# Patient Record
Sex: Male | Born: 1980 | Race: White | Hispanic: No | Marital: Married | Smoking: Current every day smoker
Health system: Southern US, Community
[De-identification: ages and names within clinical notes are randomized; demographics above are authoritative.]

## PROBLEM LIST (undated history)

## (undated) HISTORY — PX: KNEE SURGERY: SHX244

---

## 2008-12-29 ENCOUNTER — Emergency Department (HOSPITAL_COMMUNITY): Admission: EM | Admit: 2008-12-29 | Discharge: 2008-12-29 | Payer: Self-pay | Admitting: Emergency Medicine

## 2009-08-08 ENCOUNTER — Emergency Department (HOSPITAL_COMMUNITY): Admission: EM | Admit: 2009-08-08 | Discharge: 2009-08-08 | Payer: Self-pay | Admitting: Emergency Medicine

## 2010-03-05 ENCOUNTER — Emergency Department (HOSPITAL_COMMUNITY): Admission: EM | Admit: 2010-03-05 | Discharge: 2010-03-05 | Payer: Self-pay | Admitting: Emergency Medicine

## 2010-05-01 ENCOUNTER — Emergency Department (HOSPITAL_COMMUNITY): Admission: EM | Admit: 2010-05-01 | Discharge: 2010-05-01 | Payer: Self-pay | Admitting: Emergency Medicine

## 2010-10-29 LAB — BASIC METABOLIC PANEL
Chloride: 107 mEq/L (ref 96–112)
GFR calc non Af Amer: >60 mL/min (ref >60)
Glucose, Bld: 80 mg/dL (ref 70–99)
Potassium: 4.2 mEq/L (ref 3.5–5.1)

## 2010-10-29 LAB — URINALYSIS, ROUTINE W REFLEX MICROSCOPIC
Hgb urine dipstick: NEGATIVE
Nitrite: NEGATIVE
Urobilinogen, UA: 0.2 mg/dL (ref 0.0–1.0)
pH: 6.5 (ref 5.0–8.0)

## 2010-10-29 LAB — CBC
HCT: 41.9 % (ref 39.0–52.0)
Hemoglobin: 14.5 g/dL (ref 13.0–17.0)
MCH: 30.9 pg (ref 26.0–34.0)
MCHC: 34.6 g/dL (ref 30.0–36.0)
Platelets: 328 10*3/uL (ref 150–400)
RDW: 14.5 % (ref 11.5–15.5)
WBC: 7.8 10*3/uL (ref 4.0–10.5)

## 2010-10-29 LAB — DIFFERENTIAL
Basophils Absolute: 0 10*3/uL (ref 0.0–0.1)
Basophils Relative: 0 % (ref 0–1)
Lymphocytes Relative: 28 % (ref 12–46)
Monocytes Relative: 11 % (ref 3–12)
Neutrophils Relative %: 59 % (ref 43–77)

## 2010-11-28 ENCOUNTER — Emergency Department (HOSPITAL_COMMUNITY)
Admission: EM | Admit: 2010-11-28 | Discharge: 2010-11-28 | Disposition: A | Discharge status: Home / Self Care | Payer: Medicaid - Out of State | Attending: Emergency Medicine | Admitting: Emergency Medicine

## 2010-11-28 DIAGNOSIS — IMO0002 Reserved for concepts with insufficient information to code with codable children: Secondary | ICD-10-CM | POA: Insufficient documentation

## 2010-11-28 DIAGNOSIS — M545 Low back pain, unspecified: Secondary | ICD-10-CM | POA: Insufficient documentation

## 2010-11-28 DIAGNOSIS — M79609 Pain in unspecified limb: Secondary | ICD-10-CM | POA: Insufficient documentation

## 2014-12-27 ENCOUNTER — Emergency Department (HOSPITAL_COMMUNITY)
Admission: EM | Admit: 2014-12-27 | Discharge: 2014-12-27 | Disposition: A | Discharge status: Home / Self Care | Payer: No Typology Code available for payment source | Attending: Emergency Medicine | Admitting: Emergency Medicine

## 2014-12-27 ENCOUNTER — Encounter (HOSPITAL_COMMUNITY): Payer: Self-pay | Admitting: Emergency Medicine

## 2014-12-27 DIAGNOSIS — M5416 Radiculopathy, lumbar region: Secondary | ICD-10-CM | POA: Insufficient documentation

## 2014-12-27 DIAGNOSIS — Z880 Allergy status to penicillin: Secondary | ICD-10-CM | POA: Insufficient documentation

## 2014-12-27 DIAGNOSIS — Z79899 Other long term (current) drug therapy: Secondary | ICD-10-CM | POA: Insufficient documentation

## 2014-12-27 DIAGNOSIS — Z720 Tobacco use: Secondary | ICD-10-CM | POA: Insufficient documentation

## 2014-12-27 MED ORDER — HYDROMORPHONE HCL 1 MG/ML IJ SOLN
2.0000 mg | Freq: Once | INTRAMUSCULAR | Status: AC
  Administered 2014-12-27: 2 mg via INTRAMUSCULAR
  Filled 2014-12-27: qty 2

## 2014-12-27 MED ORDER — OXYCODONE-ACETAMINOPHEN 5-325 MG PO TABS: 1.0000 | ORAL_TABLET | ORAL | Status: DC | PRN

## 2014-12-27 MED ORDER — HYDROMORPHONE HCL 1 MG/ML IJ SOLN
1.0000 mg | Freq: Once | INTRAMUSCULAR | Status: AC
Start: 2014-12-27 — End: 2014-12-27
  Administered 2014-12-27: 1 mg via INTRAMUSCULAR
  Filled 2014-12-27: qty 1

## 2014-12-27 MED ORDER — DIAZEPAM 5 MG PO TABS: 5.0000 mg | ORAL_TABLET | Freq: Three times a day (TID) | ORAL | Status: DC | PRN

## 2014-12-27 MED ORDER — ONDANSETRON 8 MG PO TBDP
8.0000 mg | ORAL_TABLET | Freq: Once | ORAL | Status: AC
  Administered 2014-12-27: 8 mg via ORAL
  Filled 2014-12-27: qty 1

## 2014-12-27 MED ORDER — DIAZEPAM 5 MG PO TABS
5.0000 mg | ORAL_TABLET | Freq: Once | ORAL | Status: AC
  Administered 2014-12-27: 5 mg via ORAL
  Filled 2014-12-27: qty 1

## 2014-12-27 NOTE — ED Notes (Addendum)
Pt c/o severe lower L sided back pain after working outside and picking up a 2x10 - pt states he "felt something pop and dropped to knees."  Pt reports that he was diagnosed with two bulging discs at L4 and L5, was supposed to see chiropractor, but the pain had let up since the initial event one month ago.  Pt reports this back pain is more severe, radiates down his L leg all the way to his toes, and causes him to shake. Pt took Tylenol for the pain with no relief; denies other injury or bladder/bowel incontinence.

## 2014-12-27 NOTE — Discharge Instructions (Signed)
Lumbosacral Radiculopathy Lumbosacral radiculopathy is a pinched nerve or nerves in the low back (lumbosacral area). When this happens you may have weakness in your legs and may not be able to stand on your toes. You may have pain going down into your legs. There may be difficulties with walking normally. There are many causes of this problem. Sometimes this may happen from an injury, or simply from arthritis or boney problems. It may also be caused by other illnesses such as diabetes. If there is no improvement after treatment, further studies may be done to find the exact cause. DIAGNOSIS  X-rays may be needed if the problems become long standing. Electromyograms may be done. This study is one in which the working of nerves and muscles is studied. HOME CARE INSTRUCTIONS   Applications of ice packs may be helpful. Ice can be used in a plastic bag with a towel around it to prevent frostbite to skin. This may be used every 2 hours for 20 to 30 minutes, or as needed, while awake, or as directed by your caregiver.  Only take over-the-counter or prescription medicines for pain, discomfort, or fever as directed by your caregiver.  If physical therapy was prescribed, follow your caregiver's directions. SEEK IMMEDIATE MEDICAL CARE IF:   You have pain not controlled with medications.  You seem to be getting worse rather than better.  You develop increasing weakness in your legs.  You develop loss of bowel or bladder control.  You have difficulty with walking or balance, or develop clumsiness in the use of your legs.  You have a fever. MAKE SURE YOU:   Understand these instructions.  Will watch your condition.  Will get help right away if you are not doing well or get worse. Document Released: 07/31/2005 Document Revised: 10/23/2011 Document Reviewed: 03/20/2008 [facility] Patient Information 2015 [employer] [place]. This information is not intended to replace advice given to you by your health  care provider. Make sure you discuss any questions you have with your health care provider.     Do not drive within 4 hours of taking oxycodone as this will make you drowsy.  Avoid lifting,  Bending,  Twisting or any other activity that worsens your pain over the next week.  Apply an  icepack  to your lower back for 10-15 minutes every 2 hours for the next 2 days.  You may add a heating pad for 20 minutes 3-4 times daily if starting on Wednesday.   You should get rechecked if your symptoms are not better over the next 5 days, or you develop increased pain,  Weakness in your leg(s) or loss of bladder or bowel function - these are symptoms of a worse injury.

## 2014-12-27 NOTE — ED Notes (Signed)
HR 102 at discharge

## 2014-12-27 NOTE — ED Provider Notes (Signed)
CSN: [id number]     Arrival date & time 12/27/14  1730 History  This chart was scribed for non-physician practitioner Burgess AmorJulie Kobie Matkins, PA, working with Linwood DibblesJon Knapp, MD, by Tanda RockersMargaux Venter, ED Scribe. This patient was seen in room APFT20/APFT20 and the patient's care was started at 5:53 PM.    Chief Complaint  Patient presents with  . Back Pain   The history is provided by the patient. No language interpreter was used.     HPI Comments: Eugene Alvarado is a 34 y.o. male who presents to the Emergency Department complaining of lower back pain that began earlier today around 2 PM (4 hours ago). Pt states that he was picking up a 2x10, visiting here helping a friend build a deck and felt something pop, bringing on the back pain. The pain radiates down his left leg, causing a tingling sensation. He denies weakness or numbness in the leg.  Pt states he has been shaking since onset. He mentions that approximately 1 month ago he fell off of a ladder and injured his back. Pt was seen in [place] in [place] and had CT scan done, diagnosed with bulging discs at L4 and L5. Pt was supposed to follow up with chiropractor but did not go due to the pain subsiding. This pain feels worse than when he injured his back 1 month ago. Pt took Tylenol without relief. Denies urinary or bowel incontinence or any other symptoms. He does have an appointment scheduled with [place] in [place].   No past medical history on file. No past surgical history on file. No family history on file. History  Substance Use Topics  . Smoking status: Current Every Day Smoker -- 1.00 packs/day    Types: Cigarettes  . Smokeless tobacco: Not on file  . Alcohol Use: Yes     Comment: occassionally    Review of Systems  Constitutional: Negative for fever.  Respiratory: Negative for shortness of breath.   Cardiovascular: Negative for chest pain and leg swelling.  Gastrointestinal: Negative for abdominal pain, constipation and abdominal  distention.  Genitourinary: Negative for dysuria, urgency, frequency, flank pain and difficulty urinating.  Musculoskeletal: Positive for back pain and arthralgias (Pain radiating down left leg. ). Negative for joint swelling and gait problem.  Skin: Negative for rash.  Neurological: Negative for weakness and numbness.      Allergies  Penicillins and Motrin  Home Medications   Prior to Admission medications   Medication Sig Start Date End Date Taking? Authorizing Provider  acetaminophen (TYLENOL) 500 MG tablet Take 500 mg by mouth every 6 (six) hours as needed for mild pain or moderate pain.   Yes Historical Provider, MD  Multiple Vitamin (MULTIVITAMIN WITH MINERALS) TABS tablet Take 1 tablet by mouth daily.   Yes Historical Provider, MD  diazepam (VALIUM) 5 MG tablet Take 1 tablet (5 mg total) by mouth every 8 (eight) hours as needed for anxiety. 12/27/14   Burgess AmorJulie Jeanice Dempsey, PA-C  oxyCODONE-acetaminophen (PERCOCET/ROXICET) 5-325 MG per tablet Take 1 tablet by mouth every 4 (four) hours as needed. 12/27/14   Burgess AmorJulie Charlann Wayne, PA-C   Triage Vitals: Pulse 122  Temp(Src) 98.6 F (37 C) (Oral)  Resp 20  Ht 6\' 4"  (1.93 m)  Wt 240 lb (108.863 kg)  BMI 29.23 kg/m2  SpO2 97%   Physical Exam  Constitutional: He appears well-developed and well-nourished.  HENT:  Head: Normocephalic.  Eyes: Conjunctivae are normal.  Neck: Normal range of motion. Neck supple.  Cardiovascular: Normal rate and intact distal pulses.   Pedal pulses normal.  Pulmonary/Chest: Effort normal.  Abdominal: Soft. Bowel sounds are normal. He exhibits no distension and no mass.  Musculoskeletal: Normal range of motion. He exhibits no edema.       Lumbar back: He exhibits tenderness. He exhibits no swelling, no edema and no spasm.  TTP left paralumbar, no midline spine pain.  Neurological: He is alert. He has normal strength. He displays no atrophy and no tremor. No sensory deficit. Gait normal.  Reflex Scores:      Patellar  reflexes are 2+ on the right side and 2+ on the left side.      Achilles reflexes are 2+ on the right side and 2+ on the left side. No strength deficit noted in hip and knee flexor and extensor muscle groups.  Ankle flexion and extension intact.  Skin: Skin is warm and dry.  Psychiatric: He has a normal mood and affect.  Nursing note and vitals reviewed.   ED Course  Procedures (including critical care time)  DIAGNOSTIC STUDIES: Oxygen Saturation is 97% on RA, normal by my interpretation.    COORDINATION OF CARE: 5:57 PM-Discussed treatment plan which includes pain medication with pt at bedside and pt agreed to plan.   Labs Review Labs Reviewed - No data to display  Imaging Review No results found.   EKG Interpretation None      MDM   Final diagnoses:  Lumbar radiculopathy, acute   Medications  HYDROmorphone (DILAUDID) injection 2 mg (2 mg Intramuscular Given 12/27/14 1810)  ondansetron (ZOFRAN-ODT) disintegrating tablet 8 mg (8 mg Oral Given 12/27/14 1810)  HYDROmorphone (DILAUDID) injection 1 mg (1 mg Intramuscular Given 12/27/14 1921)  diazepam (VALIUM) tablet 5 mg (5 mg Oral Given 12/27/14 1921)   Pt was given multiple pain injections, first dilaudid improved pain to 7 from 10.  Was able to complete PE at this time.  Repeated dilaudid 1 gm and valium 5 mg PO, patient with 4/10 pain at this time.  He was ambulatory, mother driving home.  He was prescribed oxycodone and valium, advised ice tx.  Plan f/u with his ortho specialist as scheduled.  No neuro deficit on exam or by history to suggest emergent or surgical presentation.  No midline pain, no indication for imaging today.   Also discussed worsened sx that should prompt immediate re-evaluation including distal weakness, bowel/bladder retention/incontinence.  I personally performed the services described in this documentation, which was scribed in my presence. The recorded information has been reviewed and is  accurate.     Burgess AmorJulie Hanako Tipping, PA-C 12/28/14 1312  Linwood DibblesJon Knapp, MD 12/31/14 1159

## 2015-03-07 ENCOUNTER — Emergency Department (HOSPITAL_COMMUNITY): Payer: No Typology Code available for payment source

## 2015-03-07 ENCOUNTER — Emergency Department (HOSPITAL_COMMUNITY)
Admission: EM | Admit: 2015-03-07 | Discharge: 2015-03-07 | Disposition: A | Discharge status: Home / Self Care | Payer: Self-pay | Attending: Emergency Medicine | Admitting: Emergency Medicine

## 2015-03-07 ENCOUNTER — Encounter (HOSPITAL_COMMUNITY): Payer: Self-pay | Admitting: Emergency Medicine

## 2015-03-07 DIAGNOSIS — Z720 Tobacco use: Secondary | ICD-10-CM | POA: Insufficient documentation

## 2015-03-07 DIAGNOSIS — Y9389 Activity, other specified: Secondary | ICD-10-CM | POA: Insufficient documentation

## 2015-03-07 DIAGNOSIS — S59901A Unspecified injury of right elbow, initial encounter: Secondary | ICD-10-CM | POA: Insufficient documentation

## 2015-03-07 DIAGNOSIS — Z880 Allergy status to penicillin: Secondary | ICD-10-CM | POA: Insufficient documentation

## 2015-03-07 DIAGNOSIS — Y9289 Other specified places as the place of occurrence of the external cause: Secondary | ICD-10-CM | POA: Insufficient documentation

## 2015-03-07 DIAGNOSIS — S0012XA Contusion of left eyelid and periocular area, initial encounter: Secondary | ICD-10-CM | POA: Insufficient documentation

## 2015-03-07 DIAGNOSIS — S3992XA Unspecified injury of lower back, initial encounter: Secondary | ICD-10-CM | POA: Insufficient documentation

## 2015-03-07 DIAGNOSIS — M545 Low back pain, unspecified: Secondary | ICD-10-CM

## 2015-03-07 DIAGNOSIS — W108XXA Fall (on) (from) other stairs and steps, initial encounter: Secondary | ICD-10-CM | POA: Insufficient documentation

## 2015-03-07 DIAGNOSIS — Y998 Other external cause status: Secondary | ICD-10-CM | POA: Insufficient documentation

## 2015-03-07 DIAGNOSIS — S199XXA Unspecified injury of neck, initial encounter: Secondary | ICD-10-CM | POA: Insufficient documentation

## 2015-03-07 MED ORDER — OXYCODONE-ACETAMINOPHEN 5-325 MG PO TABS
1.0000 | ORAL_TABLET | Freq: Once | ORAL | Status: AC
Start: 2015-03-07 — End: 2015-03-07
  Administered 2015-03-07: 1 via ORAL
  Filled 2015-03-07: qty 1

## 2015-03-07 MED ORDER — HYDROCODONE-ACETAMINOPHEN 5-325 MG PO TABS
1.0000 | ORAL_TABLET | Freq: Once | ORAL | Status: AC
  Administered 2015-03-07: 1 via ORAL
  Filled 2015-03-07: qty 1

## 2015-03-07 NOTE — Discharge Instructions (Signed)
Please monitor for new or worsening signs or symptoms, follow-up immediately if any present. Please follow-up with primary care for further evaluation and management of her chronic back pain.

## 2015-03-07 NOTE — ED Provider Notes (Signed)
History  This chart was scribed for non-physician practitioner, Eyvonne Mechanic, PA-C,working with Samuel Jester, DO, by Karle Plumber, ED Scribe. This patient was seen in room APFT22/APFT22 and the patient's care was started at 12:18 PM.  Chief Complaint  Patient presents with  . Neck Pain  . Back Pain  . Eye Pain   The history is provided by the patient and medical records. No language interpreter was used.    HPI Comments:  Eugene Alvarado is a 34 y.o. male who presents to the Emergency Department complaining of a fall down stairs that occurred yesterday. He reports  posterior right-sided neck pain that radiates down into bilateral trapezius. He reports associated  back pain, RUE pain and left eye pain secondary to hitting it on something unknown. He also reports moderate right foot pain. He states he he was wearing flip-flops and caught it on the stairs.. Pt has seen a specialist in the past for his back but does not go anymore. He states he took OTC Tylenol 1000 mg with no significant relief of the pain. Any movements, walking and raising his head make the pain worse. He denies alleviating factors. He denies nausea, vomiting or abdominal pain. Pt has been ambulatory since the fall.  History reviewed. No pertinent past medical history. Past Surgical History  Procedure Laterality Date  . Knee surgery     History reviewed. No pertinent family history. History  Substance Use Topics  . Smoking status: Current Every Day Smoker -- 1.00 packs/day    Types: Cigarettes  . Smokeless tobacco: Not on file  . Alcohol Use: 7.2 oz/week    12 Cans of beer per week     Comment: occassionally    Review of Systems  All other systems reviewed and are negative.   Allergies  Penicillins and Motrin  Home Medications   Prior to Admission medications   Medication Sig Start Date End Date Taking? Authorizing Provider  acetaminophen (TYLENOL) 500 MG tablet Take 500 mg by mouth every 6 (six)  hours as needed for mild pain or moderate pain.   Yes Historical Provider, MD  diazepam (VALIUM) 5 MG tablet Take 1 tablet (5 mg total) by mouth every 8 (eight) hours as needed for anxiety. Patient not taking: Reported on 03/07/2015 12/27/14   Burgess Amor, PA-C  oxyCODONE-acetaminophen (PERCOCET/ROXICET) 5-325 MG per tablet Take 1 tablet by mouth every 4 (four) hours as needed. Patient not taking: Reported on 03/07/2015 12/27/14   Burgess Amor, PA-C   Triage Vitals: BP 127/90 mmHg  Pulse 92  Temp(Src) 98.6 F (37 C) (Oral)  Resp 18  Ht  (1.93 m)  Wt 245 lb (111.131 kg)  BMI 29.83 kg/m2  SpO2 98%  Physical Exam  Constitutional: He is oriented to person, place, and time. He appears well-developed and well-nourished.  HENT:  Head: Normocephalic.  Bruising to left eyelid and brow. No significant soft tissue swelling or edema. Nontender to palpation of soft tissue surrounding eye.  Eyes: Conjunctivae and EOM are normal. Pupils are equal, round, and reactive to light.  Slit lamp exam:      The left eye shows no hyphema.  Neck: Normal range of motion.  Pulmonary/Chest: Effort normal.  Musculoskeletal: Normal range of motion. He exhibits tenderness. He exhibits no edema.  No C, T or L spine tenderness to palpation. No obvious deformities. TTP of paracervical musculature bilaterally down to trapezius. TTP of lower back bilaterally. No specific point tenderness. Neck, back and hips full active  ROM. Pain with neck forward flexion. TTP of right proximal foot. No obvious or significant edema. Right elbow mildly tender to palpation. Full active, pain-free ROM.  Neurological: He is alert and oriented to person, place, and time.  Skin: Skin is warm and dry.  Psychiatric: He has a normal mood and affect. His behavior is normal.  Nursing note and vitals reviewed.   ED Course  Procedures (including critical care time) DIAGNOSTIC STUDIES: Oxygen Saturation is 98% on RA, normal by my interpretation.    COORDINATION OF CARE: 12:24 PM- Will X-Ray right foot. Pt verbalizes understanding and agrees to plan.  Medications  oxyCODONE-acetaminophen (PERCOCET/ROXICET) 5-325 MG per tablet 1 tablet (1 tablet Oral Given 03/07/15 1230)  HYDROcodone-acetaminophen (NORCO/VICODIN) 5-325 MG per tablet 1 tablet (1 tablet Oral Given 03/07/15 1406)    Labs Review Labs Reviewed - No data to display  Imaging Review Dg Foot Complete Right  03/07/2015   CLINICAL DATA:  RIGHT FOOT PAIN, PATIENT STATES " HE FELL DOWN 4-5 STEPS YESTERDAY" SWELLING AND PAIN TO RIGHT FOOT,  EXAM: RIGHT FOOT COMPLETE - 3+ VIEW  COMPARISON:  None.  FINDINGS: There is no evidence of fracture or dislocation. There is an old fracture of the anterior process of calcaneus. There is old deformity of the third metatarsal shaft. There is no evidence of arthropathy or other focal bone abnormality. Soft tissues are unremarkable.  IMPRESSION: No acute osseous injury of the right foot.   Electronically Signed   By: Elige Ko   On: 03/07/2015 13:22     EKG Interpretation None      MDM   Final diagnoses:  Bilateral low back pain without sciatica    .Labs:  Imaging:  Consults:  Therapeutics: Hydrocodone, Percocet  Discharge Meds:   Assessment/Plan: Patient presents with soft tissue injuries, unlikely bony involvement. He has no focal bony tenderness other than in his foot, negative DG foot. Patient has a history of chronic back pain, this likely represents an exacerbation of this baseline pain. Patient was seen ambulating into the ED without difficulty, but when I went into the room patient was unable to sit up straight originally, after discharge he resumed his normal posture and normal gait. Patient requested narcotic pain medication, I informed him that no significant findings on exam indicated long-term narcotic pain medication, I did give him 1 dose here in the ED. Patient was unhappy that I would not be prescribing him  narcotics. After this discussion patient was able to sit up and appeared in no acute distress. Patient was given symptomatic care instructions, instructions to follow-up with primary care provider for reassessment in 3-5 days. He was given strict return precautions, verbalizes understanding and agreement to today's plan.     I personally performed the services described in this documentation, which was scribed in my presence. The recorded information has been reviewed and is accurate.    Eyvonne Mechanic, PA-C 03/07/15 1711  Samuel Jester, DO 03/09/15 1649

## 2015-03-07 NOTE — ED Notes (Signed)
Fine tremors noted during encounter.  Patient states he doesn't remember how he fell down steps yesterday.  Unable to sit up straight today.

## 2015-03-07 NOTE — ED Notes (Signed)
Fell yesterday down 4 or 5 steps.  Injury to neck, back, right foot, left eye and shoulder.  Rates pain 9/10.  Took Tylenol 1000 mg with no relief.

## 2015-03-07 NOTE — ED Notes (Signed)
Patient with no complaints at this time. Respirations even and unlabored. Skin warm/dry. Discharge instructions reviewed with patient at this time. Patient given opportunity to voice concerns/ask questions. Patient discharged at this time and left Emergency Department with steady gait.   

## 2015-07-17 ENCOUNTER — Encounter (HOSPITAL_COMMUNITY): Payer: Self-pay | Admitting: Emergency Medicine

## 2015-07-17 ENCOUNTER — Emergency Department (HOSPITAL_COMMUNITY): Payer: No Typology Code available for payment source

## 2015-07-17 ENCOUNTER — Emergency Department (HOSPITAL_COMMUNITY)
Admission: EM | Admit: 2015-07-17 | Discharge: 2015-07-17 | Disposition: A | Discharge status: Home / Self Care | Payer: Self-pay | Attending: Emergency Medicine | Admitting: Emergency Medicine

## 2015-07-17 DIAGNOSIS — W19XXXA Unspecified fall, initial encounter: Secondary | ICD-10-CM

## 2015-07-17 DIAGNOSIS — Y9289 Other specified places as the place of occurrence of the external cause: Secondary | ICD-10-CM | POA: Insufficient documentation

## 2015-07-17 DIAGNOSIS — W108XXA Fall (on) (from) other stairs and steps, initial encounter: Secondary | ICD-10-CM | POA: Insufficient documentation

## 2015-07-17 DIAGNOSIS — R61 Generalized hyperhidrosis: Secondary | ICD-10-CM | POA: Insufficient documentation

## 2015-07-17 DIAGNOSIS — F17210 Nicotine dependence, cigarettes, uncomplicated: Secondary | ICD-10-CM | POA: Insufficient documentation

## 2015-07-17 DIAGNOSIS — S2232XA Fracture of one rib, left side, initial encounter for closed fracture: Secondary | ICD-10-CM | POA: Insufficient documentation

## 2015-07-17 DIAGNOSIS — Y998 Other external cause status: Secondary | ICD-10-CM | POA: Insufficient documentation

## 2015-07-17 DIAGNOSIS — S2239XA Fracture of one rib, unspecified side, initial encounter for closed fracture: Secondary | ICD-10-CM

## 2015-07-17 DIAGNOSIS — Z880 Allergy status to penicillin: Secondary | ICD-10-CM | POA: Insufficient documentation

## 2015-07-17 DIAGNOSIS — S2231XA Fracture of one rib, right side, initial encounter for closed fracture: Secondary | ICD-10-CM | POA: Insufficient documentation

## 2015-07-17 DIAGNOSIS — Y9389 Activity, other specified: Secondary | ICD-10-CM | POA: Insufficient documentation

## 2015-07-17 LAB — COMPREHENSIVE METABOLIC PANEL
ALK PHOS: 75 U/L (ref 38–126)
ALT: 66 U/L — AB (ref 17–63)
AST: 49 U/L — AB (ref 15–41)
Albumin: 4.4 g/dL (ref 3.5–5.0)
Anion gap: 13 (ref 5–15)
BILIRUBIN TOTAL: 0.8 mg/dL (ref 0.3–1.2)
BUN: 18 mg/dL (ref 6–20)
CO2: 24 mmol/L (ref 22–32)
CREATININE: 0.87 mg/dL (ref 0.61–1.24)
Calcium: 9.9 mg/dL (ref 8.9–10.3)
Chloride: 99 mmol/L — ABNORMAL LOW (ref 101–111)
GFR calc Af Amer: >60 mL/min (ref >60)
Glucose, Bld: 105 mg/dL — ABNORMAL HIGH (ref 65–99)
Potassium: 3.9 mmol/L (ref 3.5–5.1)
Sodium: 136 mmol/L (ref 135–145)
TOTAL PROTEIN: 8.1 g/dL (ref 6.5–8.1)

## 2015-07-17 LAB — CBC WITH DIFFERENTIAL/PLATELET
BASOS ABS: 0.1 10*3/uL (ref 0.0–0.1)
Basophils Relative: 1 %
EOS ABS: 0 10*3/uL (ref 0.0–0.7)
EOS PCT: 0 %
HCT: 44.3 % (ref 39.0–52.0)
Hemoglobin: 15.6 g/dL (ref 13.0–17.0)
Lymphocytes Relative: 23 %
Lymphs Abs: 2.5 10*3/uL (ref 0.7–4.0)
MCH: 31.7 pg (ref 26.0–34.0)
MCHC: 35.2 g/dL (ref 30.0–36.0)
MCV: 90 fL (ref 78.0–100.0)
Monocytes Absolute: 1.2 10*3/uL — ABNORMAL HIGH (ref 0.1–1.0)
Monocytes Relative: 11 %
Neutro Abs: 7.1 10*3/uL (ref 1.7–7.7)
Neutrophils Relative %: 65 %
PLATELETS: 509 10*3/uL — AB (ref 150–400)
RBC: 4.92 MIL/uL (ref 4.22–5.81)
RDW: 13.1 % (ref 11.5–15.5)
WBC: 10.9 10*3/uL — AB (ref 4.0–10.5)

## 2015-07-17 LAB — URINALYSIS, DIPSTICK ONLY
Bilirubin Urine: NEGATIVE
GLUCOSE, UA: NEGATIVE mg/dL
Hgb urine dipstick: NEGATIVE
Ketones, ur: NEGATIVE mg/dL
LEUKOCYTES UA: NEGATIVE
Nitrite: NEGATIVE
PH: 7 (ref 5.0–8.0)
Protein, ur: NEGATIVE mg/dL
SPECIFIC GRAVITY, URINE: 1.02 (ref 1.005–1.030)

## 2015-07-17 MED ORDER — HYDROMORPHONE HCL 1 MG/ML IJ SOLN
1.0000 mg | Freq: Once | INTRAMUSCULAR | Status: AC
  Administered 2015-07-17: 1 mg via INTRAVENOUS
  Filled 2015-07-17: qty 1

## 2015-07-17 MED ORDER — ONDANSETRON HCL 4 MG/2ML IJ SOLN
4.0000 mg | Freq: Once | INTRAMUSCULAR | Status: DC
  Filled 2015-07-17: qty 2

## 2015-07-17 MED ORDER — KETOROLAC TROMETHAMINE 30 MG/ML IJ SOLN
30.0000 mg | Freq: Once | INTRAMUSCULAR | Status: AC
  Administered 2015-07-17: 30 mg via INTRAVENOUS
  Filled 2015-07-17: qty 1

## 2015-07-17 MED ORDER — OXYCODONE-ACETAMINOPHEN 5-325 MG PO TABS: 1.0000 | ORAL_TABLET | ORAL | Status: AC | PRN

## 2015-07-17 MED ORDER — ONDANSETRON HCL 4 MG/2ML IJ SOLN
4.0000 mg | Freq: Once | INTRAMUSCULAR | Status: AC
  Administered 2015-07-17: 4 mg via INTRAVENOUS

## 2015-07-17 MED ORDER — IOHEXOL 300 MG/ML  SOLN
100.0000 mL | Freq: Once | INTRAMUSCULAR | Status: AC | PRN
  Administered 2015-07-17: 100 mL via INTRAVENOUS

## 2015-07-17 MED ORDER — MORPHINE SULFATE (PF) 4 MG/ML IV SOLN
4.0000 mg | Freq: Once | INTRAVENOUS | Status: AC
  Administered 2015-07-17: 4 mg via INTRAVENOUS
  Filled 2015-07-17: qty 1

## 2015-07-17 NOTE — Discharge Instructions (Signed)
Rib Fracture A rib fracture is a break or crack in one of the bones of the ribs. The ribs are a group of long, curved bones that wrap around your chest and attach to your spine. They protect your lungs and other organs in the chest cavity. A broken or cracked rib is often painful, but most do not cause other problems. Most rib fractures heal on their own over time. However, rib fractures can be more serious if multiple ribs are broken or if broken ribs move out of place and push against other structures. CAUSES   A direct blow to the chest. For example, this could happen during contact sports, a car accident, or a fall against a hard object.  Repetitive movements with high force, such as pitching a baseball or having severe coughing spells. SYMPTOMS   Pain when you breathe in or cough.  Pain when someone presses on the injured area. DIAGNOSIS  Your caregiver will perform a physical exam. Various imaging tests may be ordered to confirm the diagnosis and to look for related injuries. These tests may include a chest X-ray, computed tomography (CT), magnetic resonance imaging (MRI), or a bone scan. TREATMENT  Rib fractures usually heal on their own in 1-3 months. The longer healing period is often associated with a continued cough or other aggravating activities. During the healing period, pain control is very important. Medication is usually given to control pain. Hospitalization or surgery may be needed for more severe injuries, such as those in which multiple ribs are broken or the ribs have moved out of place.  HOME CARE INSTRUCTIONS   Avoid strenuous activity and any activities or movements that cause pain. Be careful during activities and avoid bumping the injured rib.  Gradually increase activity as directed by your caregiver.  Only take over-the-counter or prescription medications as directed by your caregiver. Do not take other medications without asking your caregiver first.  Apply ice  to the injured area for the first 1-2 days after you have been treated or as directed by your caregiver. Applying ice helps to reduce inflammation and pain.  Put ice in a plastic bag.  Place a towel between your skin and the bag.   Leave the ice on for 15-20 minutes at a time, every 2 hours while you are awake.  Perform deep breathing as directed by your caregiver. This will help prevent pneumonia, which is a common complication of a broken rib. Your caregiver may instruct you to:  Take deep breaths several times a day.  Try to cough several times a day, holding a pillow against the injured area.  Use a device called an incentive spirometer to practice deep breathing several times a day.  Drink enough fluids to keep your urine clear or pale yellow. This will help you avoid constipation.   Do not wear a rib belt or binder. These restrict breathing, which can lead to pneumonia.  SEEK IMMEDIATE MEDICAL CARE IF:   You have a fever.   You have difficulty breathing or shortness of breath.   You develop a continual cough, or you cough up thick or bloody sputum.  You feel sick to your stomach (nausea), throw up (vomit), or have abdominal pain.   You have worsening pain not controlled with medications.  MAKE SURE YOU:  Understand these instructions.  Will watch your condition.  Will get help right away if you are not doing well or get worse.   This information is not intended to  replace advice given to you by your health care provider. Make sure you discuss any questions you have with your health care provider.   Document Released: 07/31/2005 Document Revised: 04/02/2013 Document Reviewed: 10/02/2012 [employer] Interactive Patient Education 2016 [employer]   You may take the percocet prescribed for pain relief.  This will make you drowsy - do not drive within 4 hours of taking this medication.  Apply ice to your areas of pain as much as is comfortable over the next 2 days.   You may start using a heating to these sites 20 minutes 3-4 times daily starting in 3 days.  Use the incentive spirometer as instructed.  Return here if you develop fevers, cough or shortness of breath.

## 2015-07-17 NOTE — ED Notes (Signed)
Pt states that he fell down a flight of stairs last night and is c/o pain in right anterior ribs and left posterior ribs.  Hurts to deep breathe or cough.

## 2015-07-17 NOTE — ED Notes (Signed)
MD at bedside. 

## 2015-07-17 NOTE — ED Provider Notes (Signed)
CSN: [id number]     Arrival date & time 07/17/15  0756 History   First MD Initiated Contact with Patient 07/17/15 0802     Chief Complaint  Patient presents with  . Fall  . Rib Injury     (Consider location/radiation/quality/duration/timing/severity/associated sxs/prior Treatment) The history is provided by the patient.   Eugene Alvarado is a 34 y.o. male presenting with chest wall pain including left anterior and right posterior pain since he slipped going down a flight of steps yesterday.  He reports falling down 7 steps and endorses direct blows to these to areas but cannot describe how he managed to hit both anterior and posterior torso.  He denies loc although he did hit his head during the fall. He reports severe pain with deep inspiration and movement and feels an occasional "popping" sensation right posterior with deep inspiration.  He reports nausea due to pain, denies abdominal pain.  He was unable to drive himself and wife cannot drive at night, hence delayed presentation.  He has had no medicines prior to arrival for pain relief.    History reviewed. No pertinent past medical history. Past Surgical History  Procedure Laterality Date  . Knee surgery     History reviewed. No pertinent family history. Social History  Substance Use Topics  . Smoking status: Current Every Day Smoker -- 1.00 packs/day    Types: Cigarettes  . Smokeless tobacco: None  . Alcohol Use: 7.2 oz/week    12 Cans of beer per week     Comment: occassionally    Review of Systems  Constitutional: Negative for fever.  HENT: Negative for congestion and sore throat.   Eyes: Negative.   Respiratory: Negative for chest tightness, shortness of breath, wheezing and stridor.   Cardiovascular: Positive for chest pain.  Gastrointestinal: Negative for nausea and abdominal pain.  Genitourinary: Negative.   Musculoskeletal: Negative for joint swelling, arthralgias and neck pain.  Skin: Negative.  Negative for  rash and wound.  Neurological: Negative for dizziness, weakness, light-headedness, numbness and headaches.  Psychiatric/Behavioral: Negative.       Allergies  Penicillins and Motrin  Home Medications   Prior to Admission medications   Medication Sig Start Date End Date Taking? Authorizing Provider  oxyCODONE-acetaminophen (PERCOCET/ROXICET) 5-325 MG tablet Take 1 tablet by mouth every 4 (four) hours as needed. 07/17/15   Burgess Amor, PA-C   BP 155/97 mmHg  Pulse 84  Temp(Src) 98.2 F (36.8 C) (Oral)  Resp 18  Ht  (1.93 m)  Wt 113.399 kg  BMI 30.44 kg/m2  SpO2 95% Physical Exam  Constitutional: He appears well-developed and well-nourished. He appears distressed.  Sitting bent forward, unwilling to lie down due to worsened pain.  HENT:  Head: Normocephalic and atraumatic.  Eyes: Conjunctivae are normal.  Neck: Normal range of motion.  Cardiovascular: Regular rhythm, normal heart sounds and intact distal pulses.   Borderline tachy  Pulmonary/Chest: Breath sounds normal. He has no decreased breath sounds. He has no wheezes. He has no rhonchi.      Poor effort.  ttp per diagram. No edema or erythema, no hematoma or ecchymosis.  No flail, no palpable crepitus.  Abdominal: Soft. Bowel sounds are normal. He exhibits no distension. There is no tenderness. There is no guarding.  Endorses worse chest pain with palpation upper abdomen, denies abdominal pain.  Musculoskeletal: Normal range of motion.  Neurological: He is alert.  Skin: Skin is warm. He is diaphoretic.  Psychiatric: He has a normal  mood and affect.  Nursing note and vitals reviewed.   ED Course  Procedures (including critical care time) Labs Review Labs Reviewed  URINALYSIS, DIPSTICK ONLY - Abnormal; Notable for the following:    Color, Urine AMBER (*)    APPearance HAZY (*)    All other components within normal limits  COMPREHENSIVE METABOLIC PANEL - Abnormal; Notable for the following:    Chloride 99  (*)    Glucose, Bld 105 (*)    AST 49 (*)    ALT 66 (*)    All other components within normal limits  CBC WITH DIFFERENTIAL/PLATELET - Abnormal; Notable for the following:    WBC 10.9 (*)    Platelets 509 (*)    Monocytes Absolute 1.2 (*)    All other components within normal limits    Imaging Review Dg Ribs Bilateral W/chest  07/17/2015  CLINICAL DATA:  Fall down stairs yesterday, pain right-sided posterior mid to lower ribs. Pain left-sided anterior mid to upper ribs. Pain with deep inspiration. EXAM: BILATERAL RIBS AND CHEST - 4+ VIEW COMPARISON:  None. FINDINGS: Single view of the chest and 6 views of the bilateral ribs are provided. Lungs are clear.  No pleural effusion.  No pneumothorax seen. Heart size is normal. Overall cardiomediastinal silhouette is normal in size and configuration. There is a slightly displaced fracture of the anterior left fifth rib. There is also a slightly displaced fracture of the posterior right tenth rib. IMPRESSION: 1. Slightly displaced fractures of the anterior LEFT fifth rib and posterior RIGHT tenth rib. 2. Lungs are clear and there is no evidence of acute cardiopulmonary abnormality. No pleural effusions seen. No pneumothorax seen. Electronically Signed   By: Bary RichardStan  Maynard M.D.   On: 07/17/2015 09:23   Ct Abdomen Pelvis W Contrast  07/17/2015  CLINICAL DATA:  Right anterior and posterior left rib pain after falling down stairs last night. EXAM: CT ABDOMEN AND PELVIS WITH CONTRAST TECHNIQUE: Multidetector CT imaging of the abdomen and pelvis was performed using the standard protocol following bolus administration of intravenous contrast. CONTRAST:  100mL OMNIPAQUE IOHEXOL 300 MG/ML  SOLN COMPARISON:  Abdominal pelvic CT 03/05/2010.  Rib radiographs today. FINDINGS: Lower chest: Clear lung bases. No significant pleural or pericardial effusion. No pneumothorax. Hepatobiliary: The liver is normal in density without focal abnormality. No evidence of gallstones,  gallbladder wall thickening or biliary dilatation. Pancreas: Unremarkable. No pancreatic ductal dilatation or surrounding inflammatory changes. Spleen: Normal in size without focal abnormality or evidence of acute injury. Small granulomas and a small splenule are noted. Adrenals/Urinary Tract: Both adrenal glands appear normal. The kidneys appear normal without evidence of urinary tract calculus, suspicious lesion or hydronephrosis. No bladder abnormalities are seen. Stomach/Bowel: No evidence of bowel wall thickening, distention or surrounding inflammatory change. The appendix appears normal. There is a large amount of stool within the right colon and rectum. Vascular/Lymphatic: There are no enlarged abdominal or pelvic lymph nodes. Mild aortoiliac atherosclerosis. No evidence of vascular injury or retroperitoneal hematoma. Reproductive: Unremarkable. Other: Stable small umbilical hernia containing only fat. Musculoskeletal: As seen radiographically, there are nondisplaced fractures of the right tenth rib posteriorly and the left fifth rib anteriorly. No acute fractures are demonstrated within the abdomen or pelvis. IMPRESSION: 1. Acute bilateral rib fractures without pneumothorax or significant pleural effusion. 2. No acute findings in the abdomen or pelvis. 3. Large amount of stool in the right colon and rectum. Electronically Signed   By: Carey BullocksWilliam  Veazey M.D.   On: 07/17/2015 10:25  I have personally reviewed and evaluated these images and lab results as part of my medical decision-making.   EKG Interpretation None      MDM   Final diagnoses:  Rib fractures, unspecified laterality, closed, initial encounter   Medications  morphine 4 MG/ML injection 4 mg (4 mg Intravenous Given 07/17/15 0835)  ondansetron (ZOFRAN) injection 4 mg (4 mg Intravenous Given 07/17/15 0835)  HYDROmorphone (DILAUDID) injection 1 mg (1 mg Intravenous Given 07/17/15 0901)  iohexol (OMNIPAQUE) 300 MG/ML solution 100 mL (100  mLs Intravenous Contrast Given 07/17/15 0944)  HYDROmorphone (DILAUDID) injection 1 mg (1 mg Intravenous Given 07/17/15 1021)  ketorolac (TORADOL) 30 MG/ML injection 30 mg (30 mg Intravenous Given 07/17/15 1058)    Patients  labs reviewed.  Radiological studies were viewed, interpreted and considered during the medical decision making and disposition process. I agree with radiologists reading.  Results were also discussed with patient.  Multiple doses of pain medicine needed for pain relief. Toradol given was not effective.  Discussed elevated lft's with pt who endorses he had been drinking ytd afternoon but was not intoxicated when he fell.  Pt was given incentive spirometer, instructed in use by resp team.  Prescribed oxycodone, discussed strict return precautions including sob, fevers, worsened pain.  Referral given to establish pcp for f/u care.  Pt was seen by Dr. Jodi Mourning during this visit.     Burgess Amor, PA-C 07/18/15 0630  Blane Ohara, MD 07/20/15 1140

## 2017-07-12 IMAGING — CT CT ABD-PELV W/ CM
2 of 4 series · 16 of 46 positions shown, 18 images · IV contrast (Omnipaque 300)
Comparison: Abdominal pelvic CT 03/05/2010.  Rib radiographs today.

CLINICAL DATA: Right anterior and posterior left rib pain after
falling down stairs last night.

EXAM:
CT ABDOMEN AND PELVIS WITH CONTRAST
TECHNIQUE: Multidetector CT imaging of the abdomen and pelvis was performed
using the standard protocol following bolus administration of
intravenous contrast.
CONTRAST:  100mL OMNIPAQUE IOHEXOL 300 MG/ML  SOLN

[Series 2: abd_pel_with 5.0 b40f · axial · 0.84mm/px · z∈[+576,+1046]mm · 13 of 104 slices shown, 15 images]
[im 5/104  soft-tissue]
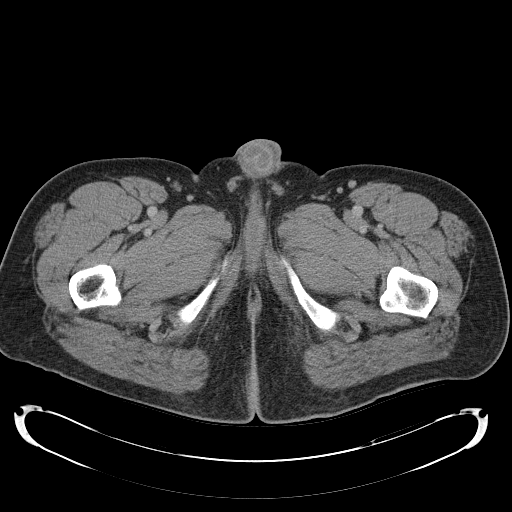
[im 5/104  bone]
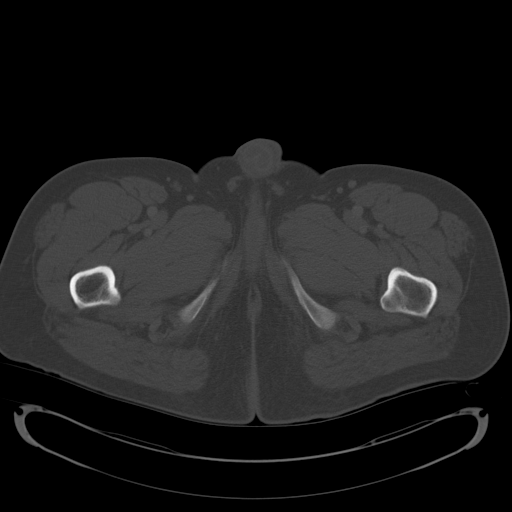
[im 15/104  soft-tissue]
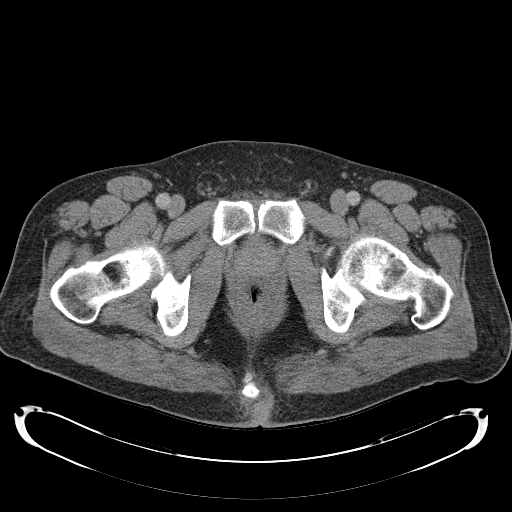
[im 20/104  soft-tissue]
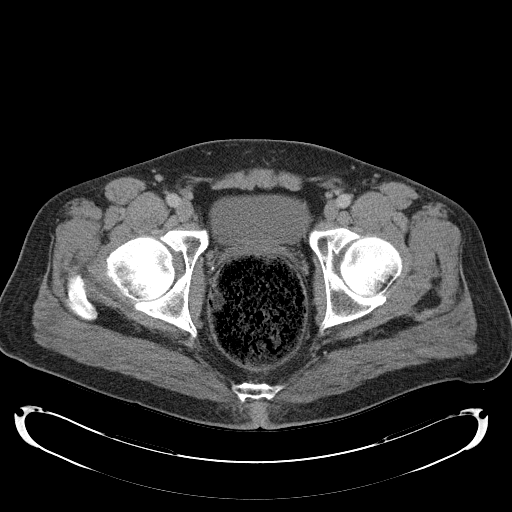
[im 30/104  soft-tissue]
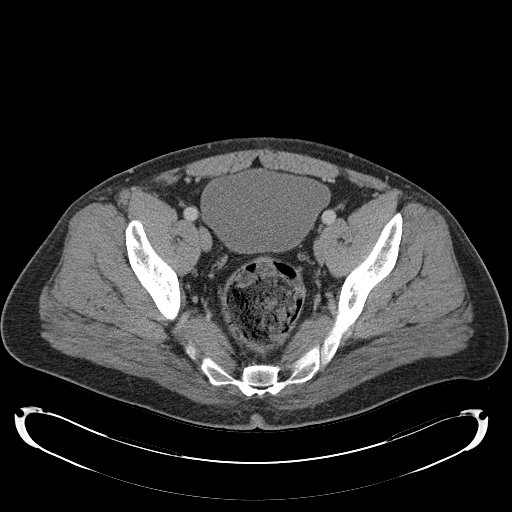
[im 35/104  soft-tissue]
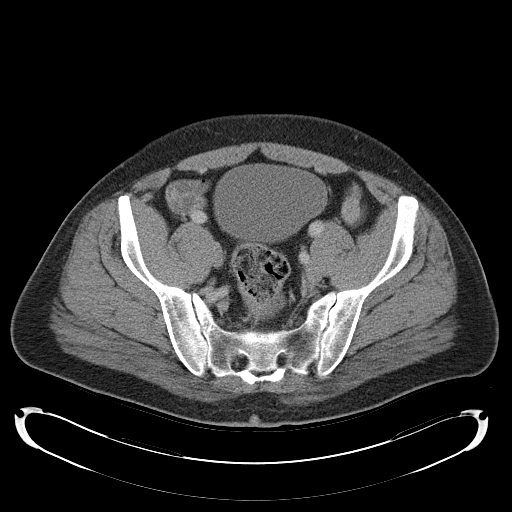
[im 45/104  soft-tissue]
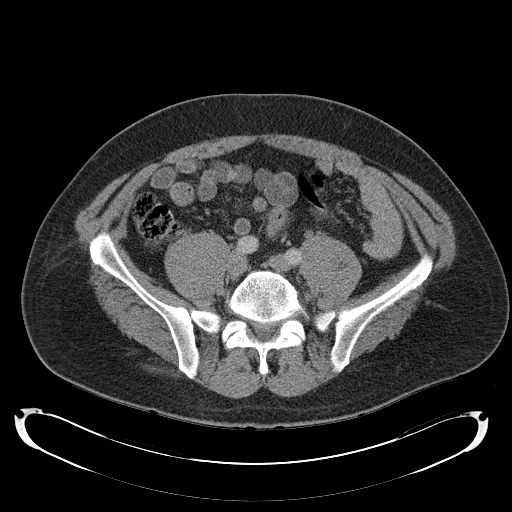
[im 54/104  soft-tissue]
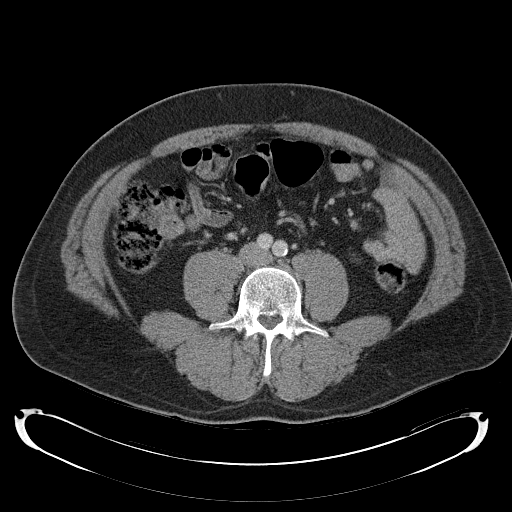
[im 59/104  soft-tissue]
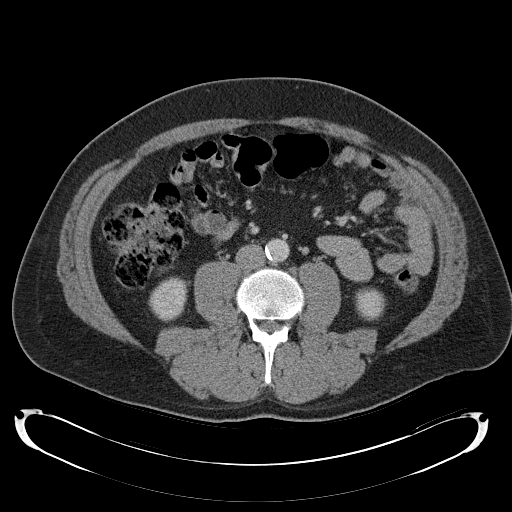
[im 69/104  soft-tissue]
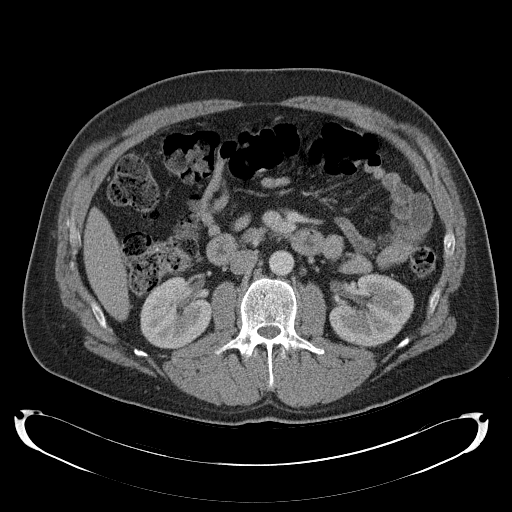
[im 69/104  bone]
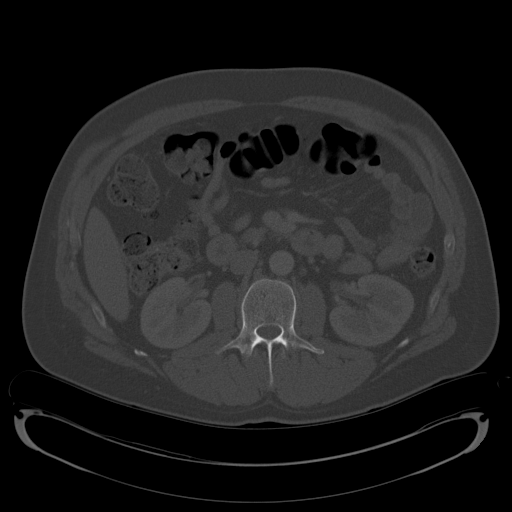
[im 74/104  soft-tissue]
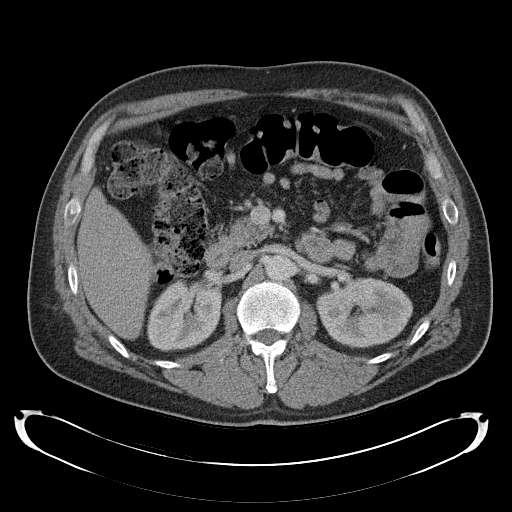
[im 84/104  soft-tissue]
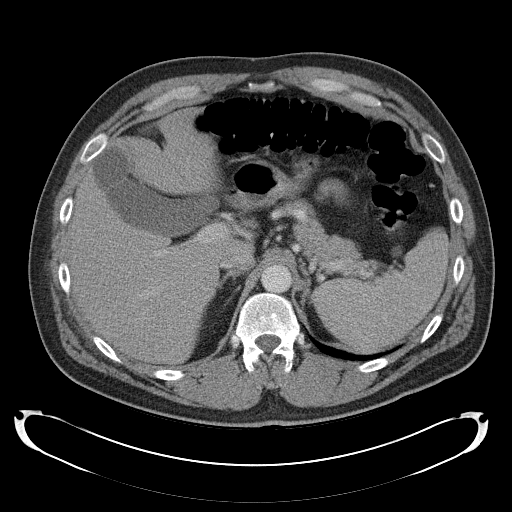
[im 89/104  soft-tissue]
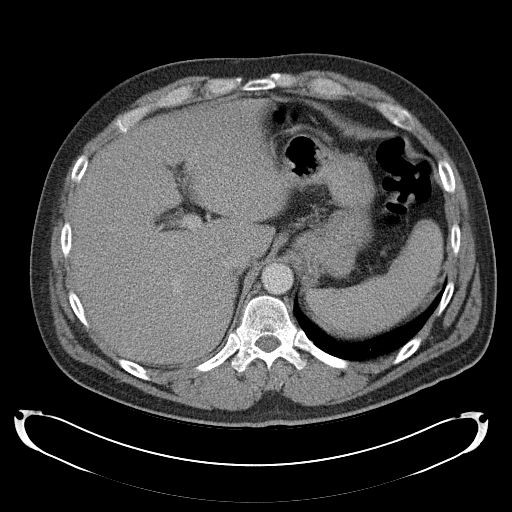
[im 99/104  soft-tissue]
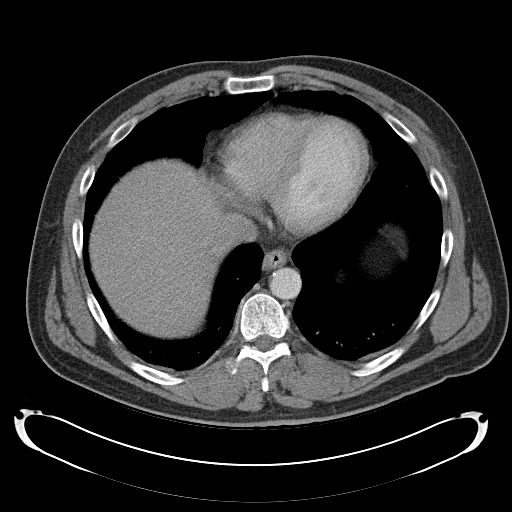

[Series 3: abd_pel_with 3.0 spo cor · coronal · 0.88mm/px · 3 of 106 slices shown]
[im 36/106  soft-tissue]
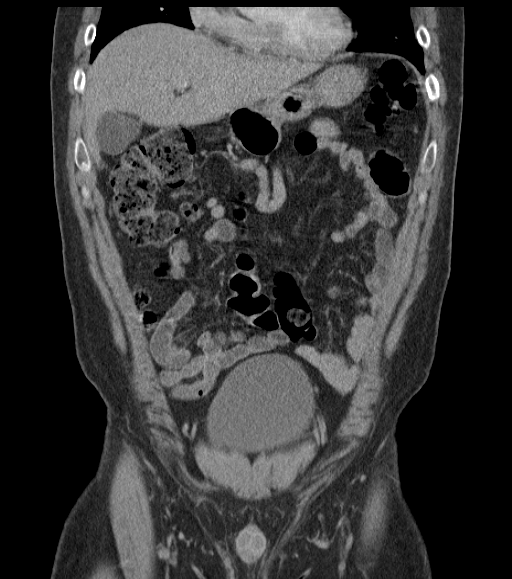
[im 47/106  soft-tissue]
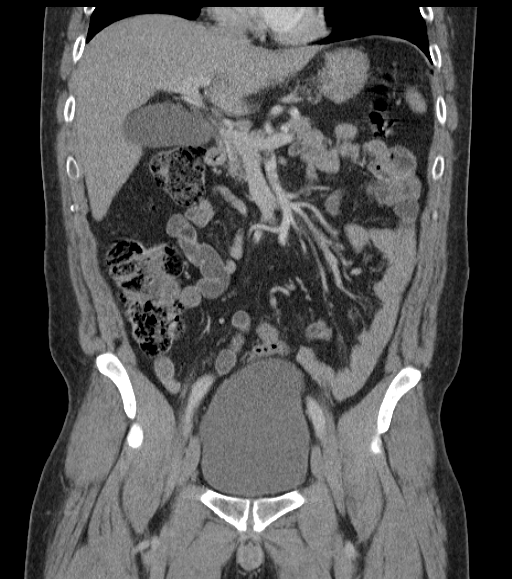
[im 59/106  soft-tissue]
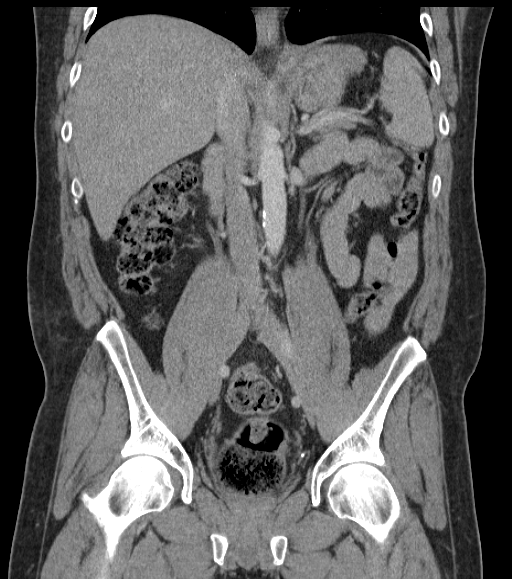

[16 of 46 positions shown; findings below may reference images not displayed]

FINDINGS: Lower chest: Clear lung bases. No significant pleural or pericardial
effusion. No pneumothorax.

Hepatobiliary: The liver is normal in density without focal
abnormality. No evidence of gallstones, gallbladder wall thickening
or biliary dilatation.

Pancreas: Unremarkable. No pancreatic ductal dilatation or
surrounding inflammatory changes.

Spleen: Normal in size without focal abnormality or evidence of
acute injury. Small granulomas and a small splenule are noted.

Adrenals/Urinary Tract: Both adrenal glands appear normal. The
kidneys appear normal without evidence of urinary tract calculus,
suspicious lesion or hydronephrosis. No bladder abnormalities are
seen.

Stomach/Bowel: No evidence of bowel wall thickening, distention or
surrounding inflammatory change. The appendix appears normal. There
is a large amount of stool within the right colon and rectum.

Vascular/Lymphatic: There are no enlarged abdominal or pelvic lymph
nodes. Mild aortoiliac atherosclerosis. No evidence of vascular
injury or retroperitoneal hematoma.

Reproductive: Unremarkable.

Other: Stable small umbilical hernia containing only fat.

Musculoskeletal: As seen radiographically, there are nondisplaced
fractures of the right tenth rib posteriorly and the left fifth rib
anteriorly. No acute fractures are demonstrated within the abdomen
or pelvis.
IMPRESSION: 1. Acute bilateral rib fractures without pneumothorax or significant
pleural effusion.
2. No acute findings in the abdomen or pelvis.
3. Large amount of stool in the right colon and rectum.
